# Patient Record
Sex: Male | Born: 2000 | Hispanic: No | Smoking: Never smoker
Health system: Southern US, Community
[De-identification: ages and names within clinical notes are randomized; demographics above are authoritative.]

## PROBLEM LIST (undated history)

## (undated) DIAGNOSIS — Q600 Renal agenesis, unilateral: Secondary | ICD-10-CM

## (undated) DIAGNOSIS — S069XAA Unspecified intracranial injury with loss of consciousness status unknown, initial encounter: Secondary | ICD-10-CM

## (undated) HISTORY — PX: ANKLE SURGERY: SHX546

---
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
## 2020-05-24 ENCOUNTER — Encounter: Payer: Self-pay | Admitting: Emergency Medicine

## 2020-05-24 ENCOUNTER — Other Ambulatory Visit: Payer: Self-pay

## 2020-05-24 DIAGNOSIS — R1010 Upper abdominal pain, unspecified: Secondary | ICD-10-CM | POA: Diagnosis not present

## 2020-05-24 DIAGNOSIS — R197 Diarrhea, unspecified: Secondary | ICD-10-CM | POA: Diagnosis not present

## 2020-05-24 DIAGNOSIS — R112 Nausea with vomiting, unspecified: Secondary | ICD-10-CM | POA: Diagnosis not present

## 2020-05-24 DIAGNOSIS — Z5321 Procedure and treatment not carried out due to patient leaving prior to being seen by health care provider: Secondary | ICD-10-CM | POA: Insufficient documentation

## 2020-05-24 LAB — COMPREHENSIVE METABOLIC PANEL
ALT: 42 U/L (ref 0–44)
AST: 56 U/L — ABNORMAL HIGH (ref 15–41)
Albumin: 4.4 g/dL (ref 3.5–5.0)
Alkaline Phosphatase: 86 U/L (ref 38–126)
Anion gap: 14 (ref 5–15)
BUN: 15 mg/dL (ref 6–20)
CO2: 27 mmol/L (ref 22–32)
Calcium: 9.3 mg/dL (ref 8.9–10.3)
Chloride: 97 mmol/L — ABNORMAL LOW (ref 98–111)
Creatinine, Ser: 1.01 mg/dL (ref 0.61–1.24)
GFR calc Af Amer: >60 mL/min (ref >60)
GFR calc non Af Amer: >60 mL/min (ref >60)
Glucose, Bld: 102 mg/dL — ABNORMAL HIGH (ref 70–99)
Potassium: 4.1 mmol/L (ref 3.5–5.1)
Sodium: 138 mmol/L (ref 135–145)
Total Bilirubin: 1.4 mg/dL — ABNORMAL HIGH (ref 0.3–1.2)
Total Protein: 6.7 g/dL (ref 6.5–8.1)

## 2020-05-24 LAB — CBC
HCT: 47.2 % (ref 39.0–52.0)
Hemoglobin: 17.4 g/dL — ABNORMAL HIGH (ref 13.0–17.0)
MCH: 33.4 pg (ref 26.0–34.0)
MCHC: 36.9 g/dL — ABNORMAL HIGH (ref 30.0–36.0)
MCV: 90.6 fL (ref 80.0–100.0)
Platelets: 223 10*3/uL (ref 150–400)
RBC: 5.21 MIL/uL (ref 4.22–5.81)
RDW: 12.1 % (ref 11.5–15.5)
WBC: 14 10*3/uL — ABNORMAL HIGH (ref 4.0–10.5)
nRBC: 0 % (ref 0.0–0.2)

## 2020-05-24 LAB — URINALYSIS, COMPLETE (UACMP) WITH MICROSCOPIC
Bilirubin Urine: NEGATIVE
Glucose, UA: NEGATIVE mg/dL
Hgb urine dipstick: NEGATIVE
Ketones, ur: 5 mg/dL — AB
Leukocytes,Ua: NEGATIVE
Nitrite: NEGATIVE
Protein, ur: NEGATIVE mg/dL
Specific Gravity, Urine: 1.029 (ref 1.005–1.030)
Squamous Epithelial / HPF: NONE SEEN (ref 0–5)
pH: 6 (ref 5.0–8.0)

## 2020-05-24 LAB — LIPASE, BLOOD: Lipase: 27 U/L (ref 11–51)

## 2020-05-24 MED ORDER — SODIUM CHLORIDE 0.9% FLUSH: 3.0000 mL | Freq: Once | INTRAVENOUS | Status: DC

## 2020-05-24 NOTE — ED Triage Notes (Signed)
Pt to ED from home c/o upper mid abd pain for about a week with nausea and vomiting and diarrhea.  Reports vomiting x3 and diarrhea x10 today.  Denies urinary changes.  States hx of one kidney.  Pt A&Ox4, chest rise even and unlabored, in NAD at this time.

## 2020-05-25 ENCOUNTER — Emergency Department
Admission: EM | Admit: 2020-05-25 | Discharge: 2020-05-25 | Disposition: A | Discharge status: Home / Self Care | Payer: Medicaid Other | Attending: Emergency Medicine | Admitting: Emergency Medicine

## 2020-05-25 HISTORY — DX: Renal agenesis, unilateral: Q60.0

## 2020-05-25 NOTE — ED Notes (Signed)
No answer when called several times from lobby 

## 2022-05-05 ENCOUNTER — Inpatient Hospital Stay (HOSPITAL_COMMUNITY): Payer: Medicaid Other

## 2022-05-05 ENCOUNTER — Emergency Department
Admission: EM | Admit: 2022-05-05 | Discharge: 2022-05-05 | Disposition: A | Discharge status: Other Acute Inpatient Hospital | Payer: Medicaid Other | Attending: Emergency Medicine | Admitting: Emergency Medicine

## 2022-05-05 ENCOUNTER — Emergency Department: Payer: Medicaid Other

## 2022-05-05 ENCOUNTER — Inpatient Hospital Stay (HOSPITAL_COMMUNITY)
Admission: AD | Admit: 2022-05-05 | Discharge: 2022-05-10 | DRG: 101 | Disposition: A | Discharge status: Home / Self Care | Payer: Medicaid Other | Source: Other Acute Inpatient Hospital | Attending: Internal Medicine | Admitting: Internal Medicine

## 2022-05-05 ENCOUNTER — Encounter (HOSPITAL_COMMUNITY): Payer: Self-pay | Admitting: Internal Medicine

## 2022-05-05 DIAGNOSIS — E0590 Thyrotoxicosis, unspecified without thyrotoxic crisis or storm: Secondary | ICD-10-CM | POA: Diagnosis present

## 2022-05-05 DIAGNOSIS — G40901 Epilepsy, unspecified, not intractable, with status epilepticus: Principal | ICD-10-CM | POA: Diagnosis present

## 2022-05-05 DIAGNOSIS — F1010 Alcohol abuse, uncomplicated: Secondary | ICD-10-CM | POA: Diagnosis present

## 2022-05-05 DIAGNOSIS — A770 Spotted fever due to Rickettsia rickettsii: Secondary | ICD-10-CM | POA: Diagnosis present

## 2022-05-05 DIAGNOSIS — R569 Unspecified convulsions: Secondary | ICD-10-CM | POA: Diagnosis present

## 2022-05-05 DIAGNOSIS — E876 Hypokalemia: Secondary | ICD-10-CM

## 2022-05-05 DIAGNOSIS — D72829 Elevated white blood cell count, unspecified: Secondary | ICD-10-CM | POA: Diagnosis present

## 2022-05-05 DIAGNOSIS — G9340 Encephalopathy, unspecified: Secondary | ICD-10-CM | POA: Diagnosis not present

## 2022-05-05 DIAGNOSIS — Q600 Renal agenesis, unilateral: Secondary | ICD-10-CM

## 2022-05-05 DIAGNOSIS — Z87820 Personal history of traumatic brain injury: Secondary | ICD-10-CM

## 2022-05-05 DIAGNOSIS — G40001 Localization-related (focal) (partial) idiopathic epilepsy and epileptic syndromes with seizures of localized onset, not intractable, with status epilepticus: Secondary | ICD-10-CM

## 2022-05-05 DIAGNOSIS — R509 Fever, unspecified: Secondary | ICD-10-CM | POA: Diagnosis present

## 2022-05-05 DIAGNOSIS — Z886 Allergy status to analgesic agent status: Secondary | ICD-10-CM

## 2022-05-05 HISTORY — DX: Unspecified intracranial injury with loss of consciousness status unknown, initial encounter: S06.9XAA

## 2022-05-05 LAB — CBC
HCT: 49.4 % (ref 39.0–52.0)
Hemoglobin: 15.9 g/dL (ref 13.0–17.0)
MCH: 32.7 pg (ref 26.0–34.0)
MCHC: 32.2 g/dL (ref 30.0–36.0)
MCV: 101.6 fL — ABNORMAL HIGH (ref 80.0–100.0)
Platelets: 319 10*3/uL (ref 150–400)
RBC: 4.86 MIL/uL (ref 4.22–5.81)
RDW: 11.1 % — ABNORMAL LOW (ref 11.5–15.5)
WBC: 19.6 10*3/uL — ABNORMAL HIGH (ref 4.0–10.5)
nRBC: 0 % (ref 0.0–0.2)

## 2022-05-05 LAB — URINE DRUG SCREEN, QUALITATIVE (ARMC ONLY)
Amphetamines, Ur Screen: NOT DETECTED
Barbiturates, Ur Screen: NOT DETECTED
Benzodiazepine, Ur Scrn: NOT DETECTED
Cannabinoid 50 Ng, Ur ~~LOC~~: NOT DETECTED
Cocaine Metabolite,Ur ~~LOC~~: NOT DETECTED
MDMA (Ecstasy)Ur Screen: NOT DETECTED
Methadone Scn, Ur: NOT DETECTED
Opiate, Ur Screen: NOT DETECTED
Phencyclidine (PCP) Ur S: NOT DETECTED
Tricyclic, Ur Screen: NOT DETECTED

## 2022-05-05 LAB — COMPREHENSIVE METABOLIC PANEL
ALT: 28 U/L (ref 0–44)
AST: 39 U/L (ref 15–41)
Albumin: 4.9 g/dL (ref 3.5–5.0)
Alkaline Phosphatase: 70 U/L (ref 38–126)
Anion gap: 25 — ABNORMAL HIGH (ref 5–15)
BUN: 9 mg/dL (ref 6–20)
CO2: 14 mmol/L — ABNORMAL LOW (ref 22–32)
Calcium: 10.1 mg/dL (ref 8.9–10.3)
Chloride: 105 mmol/L (ref 98–111)
Creatinine, Ser: 0.83 mg/dL (ref 0.61–1.24)
GFR, Estimated: >60 mL/min (ref >60)
Glucose, Bld: 129 mg/dL — ABNORMAL HIGH (ref 70–99)
Potassium: 3 mmol/L — ABNORMAL LOW (ref 3.5–5.1)
Sodium: 144 mmol/L (ref 135–145)
Total Bilirubin: 0.8 mg/dL (ref 0.3–1.2)
Total Protein: 8 g/dL (ref 6.5–8.1)

## 2022-05-05 LAB — CSF CELL COUNT WITH DIFFERENTIAL
Eosinophils, CSF: 0 %
Lymphs, CSF: 58 %
Monocyte-Macrophage-Spinal Fluid: 42 %
RBC Count, CSF: 0 /mm3 (ref 0–3)
Segmented Neutrophils-CSF: 0 %
Tube #: 3
WBC, CSF: 2 /mm3 (ref 0–5)

## 2022-05-05 LAB — CBG MONITORING, ED: Glucose-Capillary: 114 mg/dL — ABNORMAL HIGH (ref 70–99)

## 2022-05-05 LAB — ETHANOL: Alcohol, Ethyl (B): <10 mg/dL (ref <10)

## 2022-05-05 LAB — LACTIC ACID, PLASMA
Lactic Acid, Venous: >9 mmol/L (ref 0.5–1.9)
Lactic Acid, Venous: 1.8 mmol/L (ref 0.5–1.9)

## 2022-05-05 LAB — MAGNESIUM: Magnesium: 2.1 mg/dL (ref 1.7–2.4)

## 2022-05-05 LAB — AMMONIA: Ammonia: 25 umol/L (ref 9–35)

## 2022-05-05 LAB — TSH: TSH: 0.257 u[IU]/mL — ABNORMAL LOW (ref 0.350–4.500)

## 2022-05-05 LAB — PROTEIN AND GLUCOSE, CSF
Glucose, CSF: 66 mg/dL (ref 40–70)
Total  Protein, CSF: 30 mg/dL (ref 15–45)

## 2022-05-05 LAB — CK: Total CK: 130 U/L (ref 49–397)

## 2022-05-05 LAB — FOLATE: Folate: 17 ng/mL (ref >5.9)

## 2022-05-05 LAB — VITAMIN B12: Vitamin B-12: 199 pg/mL (ref 180–914)

## 2022-05-05 LAB — GLUCOSE, CAPILLARY: Glucose-Capillary: 139 mg/dL — ABNORMAL HIGH (ref 70–99)

## 2022-05-05 MED ORDER — THIAMINE HCL 100 MG PO TABS: 100.0000 mg | ORAL_TABLET | Freq: Every day | ORAL | Status: DC

## 2022-05-05 MED ORDER — INSULIN ASPART 100 UNIT/ML IJ SOLN
0.0000 [IU] | INTRAMUSCULAR | Status: DC
  Administered 2022-05-07: 1 [IU] via SUBCUTANEOUS
  Administered 2022-05-08: 2 [IU] via SUBCUTANEOUS
  Administered 2022-05-08: 1 [IU] via SUBCUTANEOUS

## 2022-05-05 MED ORDER — FOLIC ACID 1 MG PO TABS: 1.0000 mg | ORAL_TABLET | Freq: Every day | ORAL | Status: DC

## 2022-05-05 MED ORDER — VALPROATE SODIUM 100 MG/ML IV SOLN: 1400.0000 mg | Freq: Once | INTRAVENOUS | Status: DC

## 2022-05-05 MED ORDER — LORAZEPAM 2 MG/ML IJ SOLN
2.0000 mg | Freq: Once | INTRAMUSCULAR | Status: AC
  Administered 2022-05-05: 2 mg via INTRAVENOUS

## 2022-05-05 MED ORDER — POTASSIUM CHLORIDE 10 MEQ/100ML IV SOLN
10.0000 meq | INTRAVENOUS | Status: AC
  Administered 2022-05-05 (×2): 10 meq via INTRAVENOUS
  Filled 2022-05-05 (×2): qty 100

## 2022-05-05 MED ORDER — ACYCLOVIR SODIUM 50 MG/ML IV SOLN
10.0000 mg/kg | Freq: Three times a day (TID) | INTRAVENOUS | Status: DC
  Administered 2022-05-05 – 2022-05-08 (×8): 695 mg via INTRAVENOUS
  Filled 2022-05-05 (×10): qty 13.9

## 2022-05-05 MED ORDER — SODIUM CHLORIDE 0.9 % IV SOLN
1.0000 mg | Freq: Every day | INTRAVENOUS | Status: DC
  Administered 2022-05-06 – 2022-05-07 (×2): 1 mg via INTRAVENOUS
  Filled 2022-05-05 (×5): qty 0.2

## 2022-05-05 MED ORDER — LORAZEPAM 2 MG/ML IJ SOLN
2.0000 mg | INTRAMUSCULAR | Status: DC | PRN
  Administered 2022-05-06: 2 mg via INTRAVENOUS
  Filled 2022-05-05 (×2): qty 1

## 2022-05-05 MED ORDER — DEXTROSE 5 % IV SOLN
10.0000 mg/kg | Freq: Once | INTRAVENOUS | Status: AC
  Administered 2022-05-05: 695 mg via INTRAVENOUS
  Filled 2022-05-05: qty 13.9

## 2022-05-05 MED ORDER — VANCOMYCIN HCL 1750 MG/350ML IV SOLN
1750.0000 mg | Freq: Once | INTRAVENOUS | Status: AC
  Administered 2022-05-05: 1750 mg via INTRAVENOUS
  Filled 2022-05-05: qty 350

## 2022-05-05 MED ORDER — VANCOMYCIN HCL 2000 MG/400ML IV SOLN: 2000.0000 mg | Freq: Once | INTRAVENOUS | Status: DC

## 2022-05-05 MED ORDER — LORAZEPAM 2 MG/ML IJ SOLN
1.0000 mg | INTRAMUSCULAR | Status: AC | PRN
  Administered 2022-05-06: 1 mg via INTRAVENOUS
  Filled 2022-05-05: qty 1

## 2022-05-05 MED ORDER — ACETAMINOPHEN 650 MG RE SUPP
650.0000 mg | Freq: Four times a day (QID) | RECTAL | Status: DC | PRN
  Administered 2022-05-05 – 2022-05-06 (×2): 650 mg via RECTAL
  Filled 2022-05-05 (×2): qty 1

## 2022-05-05 MED ORDER — VALPROATE SODIUM 100 MG/ML IV SOLN
15.0000 mg/kg/d | Freq: Four times a day (QID) | INTRAVENOUS | Status: DC
  Administered 2022-05-05 – 2022-05-07 (×6): 260 mg via INTRAVENOUS
  Filled 2022-05-05 (×8): qty 2.6

## 2022-05-05 MED ORDER — SODIUM CHLORIDE 0.9 % IV SOLN
4000.0000 mg | Freq: Once | INTRAVENOUS | Status: AC
  Administered 2022-05-05: 4000 mg via INTRAVENOUS
  Filled 2022-05-05: qty 40

## 2022-05-05 MED ORDER — THIAMINE HCL 100 MG/ML IJ SOLN
100.0000 mg | Freq: Every day | INTRAMUSCULAR | Status: DC
  Administered 2022-05-06 – 2022-05-10 (×5): 100 mg via INTRAVENOUS
  Filled 2022-05-05 (×5): qty 2

## 2022-05-05 MED ORDER — DEXTROSE 5 % IV SOLN: 10.0000 mg/kg | Freq: Three times a day (TID) | INTRAVENOUS | Status: DC

## 2022-05-05 MED ORDER — ADULT MULTIVITAMIN W/MINERALS CH: 1.0000 | ORAL_TABLET | Freq: Every day | ORAL | Status: DC

## 2022-05-05 MED ORDER — VALPROATE SODIUM 100 MG/ML IV SOLN: 15.0000 mg/kg/d | Freq: Four times a day (QID) | INTRAVENOUS | Status: DC

## 2022-05-05 MED ORDER — LACTATED RINGERS IV SOLN: INTRAVENOUS | Status: DC

## 2022-05-05 MED ORDER — LORAZEPAM 2 MG/ML IJ SOLN
INTRAMUSCULAR | Status: AC
  Filled 2022-05-05: qty 1

## 2022-05-05 MED ORDER — LORAZEPAM 1 MG PO TABS
1.0000 mg | ORAL_TABLET | ORAL | Status: AC | PRN
  Filled 2022-05-05: qty 1

## 2022-05-05 MED ORDER — LACTATED RINGERS IV SOLN
INTRAVENOUS | Status: DC
Start: 2022-05-05 — End: 2022-05-07
  Administered 2022-05-07: 1 mL via INTRAVENOUS

## 2022-05-05 MED ORDER — LACTATED RINGERS IV BOLUS
1000.0000 mL | Freq: Once | INTRAVENOUS | Status: AC
  Administered 2022-05-05: 1000 mL via INTRAVENOUS

## 2022-05-05 MED ORDER — FOLIC ACID 1 MG PO TABS
1.0000 mg | ORAL_TABLET | Freq: Every day | ORAL | Status: DC
  Administered 2022-05-08 – 2022-05-09 (×2): 1 mg via ORAL
  Filled 2022-05-05 (×3): qty 1

## 2022-05-05 MED ORDER — SODIUM CHLORIDE 0.9 % IV BOLUS
1000.0000 mL | Freq: Once | INTRAVENOUS | Status: AC
Start: 2022-05-05 — End: 2022-05-05
  Administered 2022-05-05: 1000 mL via INTRAVENOUS

## 2022-05-05 MED ORDER — ONDANSETRON HCL 4 MG/2ML IJ SOLN: 4.0000 mg | Freq: Four times a day (QID) | INTRAMUSCULAR | Status: DC | PRN

## 2022-05-05 MED ORDER — SODIUM CHLORIDE 0.9 % IV SOLN
2.0000 g | Freq: Once | INTRAVENOUS | Status: AC
  Administered 2022-05-05: 2 g via INTRAVENOUS
  Filled 2022-05-05: qty 20

## 2022-05-05 MED ORDER — VALPROATE SODIUM 100 MG/ML IV SOLN
1400.0000 mg | Freq: Once | INTRAVENOUS | Status: AC
  Administered 2022-05-05: 1400 mg via INTRAVENOUS
  Filled 2022-05-05: qty 14

## 2022-05-05 MED ORDER — HEPARIN SODIUM (PORCINE) 5000 UNIT/ML IJ SOLN
5000.0000 [IU] | Freq: Three times a day (TID) | INTRAMUSCULAR | Status: DC
  Administered 2022-05-06 – 2022-05-09 (×10): 5000 [IU] via SUBCUTANEOUS
  Filled 2022-05-05 (×10): qty 1
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
## 2022-05-05 NOTE — Plan of Care (Signed)
Transfer from [facility] Mr. Brad Parks is a 21 year old with past medical history of TBI and congenital solitary kidney who presented for seizures and seizure-like activity.  Noted to have 2 witnessed generalized tonic-clonic seizures.  Initial labs noted lactate greater than 9 with metabolic acidosis and elevated anion gap.  Patient had been given Ativan 2 mg IV with resolution of seizure activity, Keppra for grams IV, bolused with IV fluids, potassium chloride 20 meq IV, vancomycin, Rocephin, and acyclovir.  CT scan of the head was negative.  There was concern for encephalitis for which LP was performed, but analysis seems less likely less likely infectious in nature.  UDS pending.  Case had been discussed with neurology who recommended transfer for likely need of EEG monitoring as patient had not returned to baseline.  Accepted to a telemetry bed.

## 2022-05-05 NOTE — ED Notes (Signed)
Pt accepted to [facility] 5W Room [phone]

## 2022-05-05 NOTE — ED Provider Notes (Signed)
Vitals:   05/05/22 1700 05/05/22 1742  BP: 122/71 111/64  Pulse: 73 86  Resp: (!) 29 (!) 30  Temp:  98.8 F (37.1 C)  SpO2: 100% 100%     Patient resting, no distress.  No change in mental status from last check, no evidence of ongoing seizure-like activity.  Stable appropriate for transfer to higher level of care with capabilities to provide neurology consult and EEG monitoring.  Agreement amongst patient and family to transfer to [facility] health   Sharyn Creamer, MD 05/05/22 1751

## 2022-05-05 NOTE — ED Notes (Signed)
2mg ativan given
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
## 2022-05-05 NOTE — Assessment & Plan Note (Signed)
Acute. Treated with IV kcl at [facility] ED. Will repeat BMP and Mg in AM.
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
## 2022-05-05 NOTE — Progress Notes (Signed)
STAT EEG complete - results pending. ? ?

## 2022-05-05 NOTE — ED Notes (Signed)
Pt appearing to have tremulous activity in all four extremities. Pt eyes closed and appears to be sleeping in bed.
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
## 2022-05-05 NOTE — Progress Notes (Signed)
Pharmacy Antibiotic Note  Brad Parks is a 21 y.o. male admitted on 05/05/2022 with seizures and altered mental status now with concern for HSV encephalitis. WBC 19.8 and tmax 99.6 F. Pharmacy has been consulted for acyclovir dosing.  Plan: Acyclovir 695mg  q8h  Continue lactated ringers at 134mL/h while on acyclovir for renal protection F/u renal function, culture results, and length of therapy  Temp (24hrs), Avg:98.9 F (37.2 C), Min:98.3 F (36.8 C), Max:99.6 F (37.6 C)  Recent Labs  Lab 05/05/22 1002 05/05/22 1213  WBC 19.6*  --   CREATININE 0.83  --   LATICACIDVEN >9.0* 1.8    Estimated Creatinine Clearance: 137 mL/min (by C-G formula based on SCr of 0.83 mg/dL).    No Known Allergies  Antimicrobials this admission: Acyclovir 5/27 >  Dose adjustments this admission:  Microbiology results: 5/27 CSF cx: pending  Thank you for allowing pharmacy to be a part of this patient's care.  6/27 05/05/2022 7:34 PM
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
## 2022-05-05 NOTE — Assessment & Plan Note (Addendum)
Admit to inpatient progressive telemetry bed. Per neurology consult, will continue IV Acyclovir for now until HSV PCR has resulted. Continue with IVF LR 125 ml/hr.  Neurology does not feel that IV abx are warranted at this time based on CSF results. I agree.  Continue with IV ativan 2 mg prn seizures. I gave verbal order to RN to administer 2 mg IV ativan due to active seizure on my arrival to patient room. CBG was 134. Continue with CBG q6h.  Neurology informed of patient's arrival to floor. Defer further AEDs meds to neurology service.  Seizures (Brad Parks) is a Acute illness/condition that poses a threat to life or bodily function.

## 2022-05-05 NOTE — ED Notes (Signed)
[facility] endorsing that pt has Oral temp of 100.6. Pt states that they do not take Tylenol or Ibuprofen due to having only one kidney. Pt declines any PO meds at this time.

## 2022-05-05 NOTE — Progress Notes (Signed)
Mr. Pracht admitted to [phone]. Seized less than 1 min from when transferring to stretcher to bed. Notified MD R. Katrinka Blazing, paged for new admission via AMION.   MD E. Chen in to see pt, pt had seizure upon Chens arrival. Ativan IV 2 mg given at 1924. CBG 139.

## 2022-05-05 NOTE — ED Provider Notes (Signed)
Vitals:   05/05/22 1630 05/05/22 1700  BP: 113/65 122/71  Pulse: 68 73  Resp: (!) 29 (!) 29  Temp:    SpO2: 99% 100%    Patient's somnolent, but alerts to voice.  Is well oriented.  Follows commands moves all extremities to command.  Currently voices no pain or discomfort.  Discussed and updated patient's family including mother.  Given that a bed has been assigned at [facility], they are comfortable with the plan to transfer to [facility] for higher level of care, but is significant delay they would like to consider transfer to [facility] or [facility].   Sharyn Creamer, MD 05/05/22 [phone]

## 2022-05-05 NOTE — Progress Notes (Signed)
vLtm started  all impedances below 10khms  atrium to monitor   pt event button tested    MRI compatible LEADS being used

## 2022-05-06 ENCOUNTER — Inpatient Hospital Stay (HOSPITAL_COMMUNITY): Payer: Medicaid Other

## 2022-05-06 DIAGNOSIS — R569 Unspecified convulsions: Secondary | ICD-10-CM | POA: Diagnosis not present

## 2022-05-06 LAB — CBC WITH DIFFERENTIAL/PLATELET
Abs Immature Granulocytes: 0.07 10*3/uL (ref 0.00–0.07)
Basophils Absolute: 0 10*3/uL (ref 0.0–0.1)
Basophils Relative: 0 %
Eosinophils Absolute: 0 10*3/uL (ref 0.0–0.5)
Eosinophils Relative: 0 %
HCT: 38.8 % — ABNORMAL LOW (ref 39.0–52.0)
Hemoglobin: 13.4 g/dL (ref 13.0–17.0)
Immature Granulocytes: 1 %
Lymphocytes Relative: 11 %
Lymphs Abs: 1.6 10*3/uL (ref 0.7–4.0)
MCH: 32.8 pg (ref 26.0–34.0)
MCHC: 34.5 g/dL (ref 30.0–36.0)
MCV: 95.1 fL (ref 80.0–100.0)
Monocytes Absolute: 0.6 10*3/uL (ref 0.1–1.0)
Monocytes Relative: 4 %
Neutro Abs: 12.7 10*3/uL — ABNORMAL HIGH (ref 1.7–7.7)
Neutrophils Relative %: 84 %
Platelets: 227 10*3/uL (ref 150–400)
RBC: 4.08 MIL/uL — ABNORMAL LOW (ref 4.22–5.81)
RDW: 11.1 % — ABNORMAL LOW (ref 11.5–15.5)
WBC: 15 10*3/uL — ABNORMAL HIGH (ref 4.0–10.5)
nRBC: 0 % (ref 0.0–0.2)

## 2022-05-06 LAB — COMPREHENSIVE METABOLIC PANEL
ALT: 22 U/L (ref 0–44)
AST: 23 U/L (ref 15–41)
Albumin: 3.4 g/dL — ABNORMAL LOW (ref 3.5–5.0)
Alkaline Phosphatase: 53 U/L (ref 38–126)
Anion gap: 10 (ref 5–15)
BUN: 6 mg/dL (ref 6–20)
CO2: 22 mmol/L (ref 22–32)
Calcium: 9.1 mg/dL (ref 8.9–10.3)
Chloride: 109 mmol/L (ref 98–111)
Creatinine, Ser: 0.89 mg/dL (ref 0.61–1.24)
GFR, Estimated: >60 mL/min (ref >60)
Glucose, Bld: 121 mg/dL — ABNORMAL HIGH (ref 70–99)
Potassium: 3.6 mmol/L (ref 3.5–5.1)
Sodium: 141 mmol/L (ref 135–145)
Total Bilirubin: 0.8 mg/dL (ref 0.3–1.2)
Total Protein: 5.5 g/dL — ABNORMAL LOW (ref 6.5–8.1)

## 2022-05-06 LAB — GLUCOSE, CAPILLARY
Glucose-Capillary: 100 mg/dL — ABNORMAL HIGH (ref 70–99)
Glucose-Capillary: 112 mg/dL — ABNORMAL HIGH (ref 70–99)
Glucose-Capillary: 115 mg/dL — ABNORMAL HIGH (ref 70–99)
Glucose-Capillary: 115 mg/dL — ABNORMAL HIGH (ref 70–99)
Glucose-Capillary: 115 mg/dL — ABNORMAL HIGH (ref 70–99)
Glucose-Capillary: 117 mg/dL — ABNORMAL HIGH (ref 70–99)

## 2022-05-06 LAB — TSH: TSH: 0.222 u[IU]/mL — ABNORMAL LOW (ref 0.350–4.500)

## 2022-05-06 LAB — T4, FREE: Free T4: 1.32 ng/dL — ABNORMAL HIGH (ref 0.61–1.12)

## 2022-05-06 LAB — HIV ANTIBODY (ROUTINE TESTING W REFLEX): HIV Screen 4th Generation wRfx: NONREACTIVE

## 2022-05-06 LAB — HEMOGLOBIN A1C
Hgb A1c MFr Bld: 5.2 % (ref 4.8–5.6)
Mean Plasma Glucose: 102.54 mg/dL

## 2022-05-06 LAB — MAGNESIUM: Magnesium: 1.7 mg/dL (ref 1.7–2.4)

## 2022-05-06 LAB — MRSA NEXT GEN BY PCR, NASAL: MRSA by PCR Next Gen: NOT DETECTED

## 2022-05-06 MED ORDER — SODIUM CHLORIDE 0.9 % IV SOLN
2.0000 g | INTRAVENOUS | Status: DC
  Administered 2022-05-06 – 2022-05-09 (×4): 2 g via INTRAVENOUS
  Filled 2022-05-06 (×5): qty 20

## 2022-05-06 MED ORDER — METHIMAZOLE 5 MG PO TABS
5.0000 mg | ORAL_TABLET | Freq: Three times a day (TID) | ORAL | Status: DC
  Administered 2022-05-06 – 2022-05-10 (×13): 5 mg via ORAL
  Filled 2022-05-06 (×14): qty 1

## 2022-05-06 MED ORDER — SODIUM CHLORIDE 0.9 % IV SOLN
100.0000 mg | Freq: Two times a day (BID) | INTRAVENOUS | Status: DC
  Administered 2022-05-06 – 2022-05-10 (×8): 100 mg via INTRAVENOUS
  Filled 2022-05-06 (×10): qty 100

## 2022-05-06 NOTE — Progress Notes (Signed)
EEG maint complete.  ?

## 2022-05-06 NOTE — Plan of Care (Signed)
  Problem: Education: Goal: Knowledge of General Education information will improve Description Including pain rating scale, medication(s)/side effects and non-pharmacologic comfort measures Outcome: Progressing   
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
## 2022-05-07 DIAGNOSIS — R569 Unspecified convulsions: Secondary | ICD-10-CM | POA: Diagnosis not present

## 2022-05-07 LAB — COMPREHENSIVE METABOLIC PANEL
ALT: 19 U/L (ref 0–44)
AST: 17 U/L (ref 15–41)
Albumin: 3.4 g/dL — ABNORMAL LOW (ref 3.5–5.0)
Alkaline Phosphatase: 46 U/L (ref 38–126)
Anion gap: 10 (ref 5–15)
BUN: <5 mg/dL — ABNORMAL LOW (ref 6–20)
CO2: 22 mmol/L (ref 22–32)
Calcium: 9 mg/dL (ref 8.9–10.3)
Chloride: 111 mmol/L (ref 98–111)
Creatinine, Ser: 0.79 mg/dL (ref 0.61–1.24)
GFR, Estimated: >60 mL/min (ref >60)
Glucose, Bld: 112 mg/dL — ABNORMAL HIGH (ref 70–99)
Potassium: 3.3 mmol/L — ABNORMAL LOW (ref 3.5–5.1)
Sodium: 143 mmol/L (ref 135–145)
Total Bilirubin: 0.7 mg/dL (ref 0.3–1.2)
Total Protein: 5.4 g/dL — ABNORMAL LOW (ref 6.5–8.1)

## 2022-05-07 LAB — GASTROINTESTINAL PANEL BY PCR, STOOL (REPLACES STOOL CULTURE)

## 2022-05-07 LAB — CBC WITH DIFFERENTIAL/PLATELET
Abs Immature Granulocytes: 0.03 10*3/uL (ref 0.00–0.07)
Basophils Absolute: 0 10*3/uL (ref 0.0–0.1)
Basophils Relative: 0 %
Eosinophils Absolute: 0 10*3/uL (ref 0.0–0.5)
Eosinophils Relative: 0 %
HCT: 38.1 % — ABNORMAL LOW (ref 39.0–52.0)
Hemoglobin: 13.2 g/dL (ref 13.0–17.0)
Immature Granulocytes: 0 %
Lymphocytes Relative: 15 %
Lymphs Abs: 1.7 10*3/uL (ref 0.7–4.0)
MCH: 32.8 pg (ref 26.0–34.0)
MCHC: 34.6 g/dL (ref 30.0–36.0)
MCV: 94.5 fL (ref 80.0–100.0)
Monocytes Absolute: 0.8 10*3/uL (ref 0.1–1.0)
Monocytes Relative: 7 %
Neutro Abs: 9.1 10*3/uL — ABNORMAL HIGH (ref 1.7–7.7)
Neutrophils Relative %: 78 %
Platelets: 194 10*3/uL (ref 150–400)
RBC: 4.03 MIL/uL — ABNORMAL LOW (ref 4.22–5.81)
RDW: 10.9 % — ABNORMAL LOW (ref 11.5–15.5)
WBC: 11.6 10*3/uL — ABNORMAL HIGH (ref 4.0–10.5)
nRBC: 0 % (ref 0.0–0.2)

## 2022-05-07 LAB — C-REACTIVE PROTEIN: CRP: 0.5 mg/dL (ref <1.0)

## 2022-05-07 LAB — VITAMIN B12: Vitamin B-12: 183 pg/mL (ref 180–914)

## 2022-05-07 LAB — C DIFFICILE QUICK SCREEN W PCR REFLEX
C Diff antigen: POSITIVE — AB
C Diff toxin: NEGATIVE

## 2022-05-07 LAB — GLUCOSE, CAPILLARY
Glucose-Capillary: 100 mg/dL — ABNORMAL HIGH (ref 70–99)
Glucose-Capillary: 104 mg/dL — ABNORMAL HIGH (ref 70–99)
Glucose-Capillary: 124 mg/dL — ABNORMAL HIGH (ref 70–99)
Glucose-Capillary: 129 mg/dL — ABNORMAL HIGH (ref 70–99)
Glucose-Capillary: 94 mg/dL (ref 70–99)
Glucose-Capillary: 97 mg/dL (ref 70–99)

## 2022-05-07 LAB — HSV 1/2 PCR, CSF
HSV-1 DNA: NEGATIVE
HSV-2 DNA: NEGATIVE

## 2022-05-07 LAB — CLOSTRIDIUM DIFFICILE BY PCR, REFLEXED: Toxigenic C. Difficile by PCR: NEGATIVE

## 2022-05-07 MED ORDER — LACTATED RINGERS IV SOLN: INTRAVENOUS | Status: DC

## 2022-05-07 MED ORDER — NICOTINE 21 MG/24HR TD PT24
21.0000 mg | MEDICATED_PATCH | Freq: Every day | TRANSDERMAL | Status: DC
  Administered 2022-05-07 – 2022-05-09 (×3): 21 mg via TRANSDERMAL
  Filled 2022-05-07 (×3): qty 1

## 2022-05-07 MED ORDER — CYANOCOBALAMIN 1000 MCG/ML IJ SOLN
1000.0000 ug | Freq: Every day | INTRAMUSCULAR | Status: AC
  Administered 2022-05-07 – 2022-05-09 (×3): 1000 ug via SUBCUTANEOUS
  Filled 2022-05-07 (×3): qty 1

## 2022-05-07 MED ORDER — POTASSIUM CHLORIDE CRYS ER 20 MEQ PO TBCR
40.0000 meq | EXTENDED_RELEASE_TABLET | Freq: Once | ORAL | Status: AC
  Administered 2022-05-07: 40 meq via ORAL
  Filled 2022-05-07: qty 2

## 2022-05-07 MED ORDER — VITAMIN B-12 1000 MCG PO TABS
1000.0000 ug | ORAL_TABLET | Freq: Every day | ORAL | Status: DC
  Administered 2022-05-10: 1000 ug via ORAL
  Filled 2022-05-07: qty 1

## 2022-05-07 MED ORDER — ACETAMINOPHEN 325 MG PO TABS
650.0000 mg | ORAL_TABLET | Freq: Four times a day (QID) | ORAL | Status: DC | PRN
  Administered 2022-05-07 – 2022-05-08 (×2): 650 mg via ORAL
  Filled 2022-05-07 (×2): qty 2

## 2022-05-07 MED ORDER — VALPROATE SODIUM 100 MG/ML IV SOLN
500.0000 mg | Freq: Two times a day (BID) | INTRAVENOUS | Status: DC
  Administered 2022-05-07 – 2022-05-09 (×4): 500 mg via INTRAVENOUS
  Filled 2022-05-07 (×6): qty 5

## 2022-05-07 MED ORDER — LOPERAMIDE HCL 2 MG PO CAPS: 2.0000 mg | ORAL_CAPSULE | Freq: Four times a day (QID) | ORAL | Status: DC | PRN

## 2022-05-07 NOTE — TOC CAGE-AID Note (Addendum)
Transition of Care [facility]) - CAGE-AID Screening   Patient Details  Name: Brad Parks MRN: [id number] Date of Birth: 14-Jun-2001  Transition of Care [facility]) CM/SW Contact:    Deron Poole, LCSW Phone Number: 05/07/2022, 2:38 PM   Clinical Narrative: CSW spoke with [name] at bedside along with his spouse to address the SA consult however [name] declined needing any resources/support for etoh. [name] reported that he does have a PCP and uses the [facility] and sometimes the [facility] as his pharmacy.  TOC will continue to follow for DC needs.   CAGE-AID Screening:    Have You Ever Felt You Ought to Cut Down on Your Drinking or Drug Use?: No Have People Annoyed You By Critizing Your Drinking Or Drug Use?: No Have You Felt Bad Or Guilty About Your Drinking Or Drug Use?: No Have You Ever Had a Drink or Used Drugs First Thing In The Morning to Steady Your Nerves or to Get Rid of a Hangover?: No CAGE-AID Score: 0  Substance Abuse Education Offered: Yes  Substance abuse interventions: Other (must comment) (substance use educational materials offered but pt. declined.)

## 2022-05-07 NOTE — Progress Notes (Signed)
The patient is having frequent BMs and complaining of pain when wiping. Barrier cream applied and notified Dr. Toniann Fail. Awaiting for a response. Will continue to monitor.
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
## 2022-05-07 NOTE — Progress Notes (Signed)
OT Cancellation Note  Patient Details Name: CONLIN LEBEAU MRN: [id number] DOB: 01-Oct-2001   Cancelled Treatment:    Reason Eval/Treat Not Completed: OT screened, no needs identified, will sign off  Malka So 05/07/2022, 2:09 PM Nestor Lewandowsky, OTR/L Acute Rehabilitation Services Pager: [phone] Office: [phone]
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
## 2022-05-07 NOTE — Progress Notes (Addendum)
New order received for GI panel from Dr. Toniann Fail.
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
## 2022-05-08 DIAGNOSIS — R569 Unspecified convulsions: Secondary | ICD-10-CM | POA: Diagnosis not present

## 2022-05-08 LAB — GLUCOSE, CAPILLARY
Glucose-Capillary: 110 mg/dL — ABNORMAL HIGH (ref 70–99)
Glucose-Capillary: 119 mg/dL — ABNORMAL HIGH (ref 70–99)
Glucose-Capillary: 129 mg/dL — ABNORMAL HIGH (ref 70–99)
Glucose-Capillary: 132 mg/dL — ABNORMAL HIGH (ref 70–99)
Glucose-Capillary: 170 mg/dL — ABNORMAL HIGH (ref 70–99)

## 2022-05-08 LAB — CSF CULTURE W GRAM STAIN
Culture: NO GROWTH
Gram Stain: NONE SEEN
Special Requests: NORMAL

## 2022-05-08 LAB — CBC WITH DIFFERENTIAL/PLATELET
Abs Immature Granulocytes: 0.03 10*3/uL (ref 0.00–0.07)
Basophils Absolute: 0 10*3/uL (ref 0.0–0.1)
Basophils Relative: 0 %
Eosinophils Absolute: 0 10*3/uL (ref 0.0–0.5)
Eosinophils Relative: 0 %
HCT: 39.9 % (ref 39.0–52.0)
Hemoglobin: 13.6 g/dL (ref 13.0–17.0)
Immature Granulocytes: 0 %
Lymphocytes Relative: 21 %
Lymphs Abs: 2 10*3/uL (ref 0.7–4.0)
MCH: 32.5 pg (ref 26.0–34.0)
MCHC: 34.1 g/dL (ref 30.0–36.0)
MCV: 95.5 fL (ref 80.0–100.0)
Monocytes Absolute: 0.7 10*3/uL (ref 0.1–1.0)
Monocytes Relative: 8 %
Neutro Abs: 7 10*3/uL (ref 1.7–7.7)
Neutrophils Relative %: 71 %
Platelets: 181 10*3/uL (ref 150–400)
RBC: 4.18 MIL/uL — ABNORMAL LOW (ref 4.22–5.81)
RDW: 10.9 % — ABNORMAL LOW (ref 11.5–15.5)
WBC: 9.8 10*3/uL (ref 4.0–10.5)
nRBC: 0 % (ref 0.0–0.2)

## 2022-05-08 LAB — COMPREHENSIVE METABOLIC PANEL
ALT: 18 U/L (ref 0–44)
AST: 17 U/L (ref 15–41)
Albumin: 3.5 g/dL (ref 3.5–5.0)
Alkaline Phosphatase: 40 U/L (ref 38–126)
Anion gap: 9 (ref 5–15)
BUN: <5 mg/dL — ABNORMAL LOW (ref 6–20)
CO2: 22 mmol/L (ref 22–32)
Calcium: 9.3 mg/dL (ref 8.9–10.3)
Chloride: 111 mmol/L (ref 98–111)
Creatinine, Ser: 0.81 mg/dL (ref 0.61–1.24)
GFR, Estimated: >60 mL/min (ref >60)
Glucose, Bld: 128 mg/dL — ABNORMAL HIGH (ref 70–99)
Potassium: 3.3 mmol/L — ABNORMAL LOW (ref 3.5–5.1)
Sodium: 142 mmol/L (ref 135–145)
Total Bilirubin: 0.3 mg/dL (ref 0.3–1.2)
Total Protein: 5.5 g/dL — ABNORMAL LOW (ref 6.5–8.1)

## 2022-05-08 LAB — C-REACTIVE PROTEIN: CRP: <0.5 mg/dL (ref <1.0)

## 2022-05-08 LAB — MAGNESIUM: Magnesium: 1.6 mg/dL — ABNORMAL LOW (ref 1.7–2.4)

## 2022-05-08 LAB — VITAMIN B1: Vitamin B1 (Thiamine): 171.8 nmol/L (ref 66.5–200.0)

## 2022-05-08 LAB — LYME DISEASE SEROLOGY W/REFLEX
Lyme Total Antibody EIA: NEGATIVE
Lyme Total Antibody EIA: NEGATIVE

## 2022-05-08 LAB — VALPROIC ACID LEVEL: Valproic Acid Lvl: 59 ug/mL (ref 50.0–100.0)

## 2022-05-08 MED ORDER — MELATONIN 3 MG PO TABS
3.0000 mg | ORAL_TABLET | Freq: Every day | ORAL | Status: DC
  Administered 2022-05-08 – 2022-05-09 (×2): 3 mg via ORAL
  Filled 2022-05-08 (×2): qty 1

## 2022-05-08 MED ORDER — PROCHLORPERAZINE EDISYLATE 10 MG/2ML IJ SOLN
10.0000 mg | Freq: Once | INTRAMUSCULAR | Status: AC
  Administered 2022-05-08: 10 mg via INTRAVENOUS
  Filled 2022-05-08: qty 2

## 2022-05-08 MED ORDER — CODEINE SULFATE 15 MG PO TABS
30.0000 mg | ORAL_TABLET | Freq: Once | ORAL | Status: AC
  Administered 2022-05-08: 30 mg via ORAL
  Filled 2022-05-08: qty 2

## 2022-05-08 MED ORDER — POTASSIUM CHLORIDE CRYS ER 20 MEQ PO TBCR
40.0000 meq | EXTENDED_RELEASE_TABLET | Freq: Two times a day (BID) | ORAL | Status: AC
  Administered 2022-05-08 (×2): 40 meq via ORAL
  Filled 2022-05-08 (×2): qty 2

## 2022-05-08 MED ORDER — MAGNESIUM SULFATE 4 GM/100ML IV SOLN
4.0000 g | Freq: Once | INTRAVENOUS | Status: AC
  Administered 2022-05-08: 4 g via INTRAVENOUS
  Filled 2022-05-08: qty 100
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
## 2022-05-09 DIAGNOSIS — R569 Unspecified convulsions: Secondary | ICD-10-CM | POA: Diagnosis not present

## 2022-05-09 LAB — ROCKY MTN SPOTTED FVR ABS PNL(IGG+IGM)
RMSF IgG: UNDETERMINED
RMSF IgG: NEGATIVE
RMSF IgM: 0.2 index (ref 0.00–0.89)
RMSF IgM: 0.27 index (ref 0.00–0.89)

## 2022-05-09 LAB — CBC WITH DIFFERENTIAL/PLATELET
Abs Immature Granulocytes: 0.02 10*3/uL (ref 0.00–0.07)
Basophils Absolute: 0 10*3/uL (ref 0.0–0.1)
Basophils Relative: 0 %
Eosinophils Absolute: 0.1 10*3/uL (ref 0.0–0.5)
Eosinophils Relative: 1 %
HCT: 40.9 % (ref 39.0–52.0)
Hemoglobin: 14.4 g/dL (ref 13.0–17.0)
Immature Granulocytes: 0 %
Lymphocytes Relative: 31 %
Lymphs Abs: 2 10*3/uL (ref 0.7–4.0)
MCH: 33 pg (ref 26.0–34.0)
MCHC: 35.2 g/dL (ref 30.0–36.0)
MCV: 93.8 fL (ref 80.0–100.0)
Monocytes Absolute: 0.5 10*3/uL (ref 0.1–1.0)
Monocytes Relative: 8 %
Neutro Abs: 3.9 10*3/uL (ref 1.7–7.7)
Neutrophils Relative %: 60 %
Platelets: 175 10*3/uL (ref 150–400)
RBC: 4.36 MIL/uL (ref 4.22–5.81)
RDW: 11 % — ABNORMAL LOW (ref 11.5–15.5)
WBC: 6.5 10*3/uL (ref 4.0–10.5)
nRBC: 0 % (ref 0.0–0.2)

## 2022-05-09 LAB — COMPREHENSIVE METABOLIC PANEL
ALT: 43 U/L (ref 0–44)
AST: 21 U/L (ref 15–41)
Albumin: 3.8 g/dL (ref 3.5–5.0)
Alkaline Phosphatase: 48 U/L (ref 38–126)
Anion gap: 7 (ref 5–15)
BUN: <5 mg/dL — ABNORMAL LOW (ref 6–20)
CO2: 24 mmol/L (ref 22–32)
Calcium: 9.5 mg/dL (ref 8.9–10.3)
Chloride: 110 mmol/L (ref 98–111)
Creatinine, Ser: 0.79 mg/dL (ref 0.61–1.24)
GFR, Estimated: >60 mL/min (ref >60)
Glucose, Bld: 100 mg/dL — ABNORMAL HIGH (ref 70–99)
Potassium: 4 mmol/L (ref 3.5–5.1)
Sodium: 141 mmol/L (ref 135–145)
Total Bilirubin: 0.6 mg/dL (ref 0.3–1.2)
Total Protein: 6 g/dL — ABNORMAL LOW (ref 6.5–8.1)

## 2022-05-09 LAB — VALPROIC ACID LEVEL: Valproic Acid Lvl: 53 ug/mL (ref 50.0–100.0)

## 2022-05-09 LAB — MAGNESIUM: Magnesium: 2.1 mg/dL (ref 1.7–2.4)

## 2022-05-09 LAB — GLUCOSE, CAPILLARY
Glucose-Capillary: 105 mg/dL — ABNORMAL HIGH (ref 70–99)
Glucose-Capillary: 126 mg/dL — ABNORMAL HIGH (ref 70–99)
Glucose-Capillary: 94 mg/dL (ref 70–99)
Glucose-Capillary: 96 mg/dL (ref 70–99)

## 2022-05-09 LAB — RMSF, IGG, IFA: RMSF, IGG, IFA: <1:64 {titer}

## 2022-05-09 LAB — C-REACTIVE PROTEIN: CRP: <0.5 mg/dL (ref <1.0)

## 2022-05-09 MED ORDER — PANTOPRAZOLE SODIUM 40 MG PO TBEC
40.0000 mg | DELAYED_RELEASE_TABLET | Freq: Every day | ORAL | Status: DC
  Administered 2022-05-10: 40 mg via ORAL
  Filled 2022-05-09: qty 1

## 2022-05-09 MED ORDER — NICOTINE 21 MG/24HR TD PT24: 21.0000 mg | MEDICATED_PATCH | Freq: Every day | TRANSDERMAL | Status: DC

## 2022-05-09 MED ORDER — DIVALPROEX SODIUM 125 MG PO CSDR
500.0000 mg | DELAYED_RELEASE_CAPSULE | Freq: Two times a day (BID) | ORAL | Status: DC
  Administered 2022-05-09 – 2022-05-10 (×2): 500 mg via ORAL
  Filled 2022-05-09 (×2): qty 4

## 2022-05-09 MED ORDER — NICOTINE 21 MG/24HR TD PT24
21.0000 mg | MEDICATED_PATCH | Freq: Every day | TRANSDERMAL | Status: DC
Start: 2022-05-09 — End: 2022-05-10
  Administered 2022-05-09: 21 mg via TRANSDERMAL
  Filled 2022-05-09 (×2): qty 1

## 2022-05-09 MED ORDER — FOLIC ACID 5 MG/ML IJ SOLN
1.0000 mg | Freq: Every day | INTRAMUSCULAR | Status: DC
  Filled 2022-05-09: qty 0.2

## 2022-05-09 MED ORDER — FOLIC ACID 1 MG PO TABS
1.0000 mg | ORAL_TABLET | Freq: Every day | ORAL | Status: DC
  Administered 2022-05-10: 1 mg via ORAL
  Filled 2022-05-09: qty 1

## 2022-05-09 MED ORDER — OMEPRAZOLE MAGNESIUM 20 MG PO TBEC: 20.0000 mg | DELAYED_RELEASE_TABLET | Freq: Every day | ORAL | Status: DC
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
## 2022-05-09 NOTE — Progress Notes (Signed)
Ramesh,MD at bedside requested that q4 POCT blood sugar not be taken at this moment. He is reviewing pt case, POCT blood sugar q4 may be DC. He stated that 0723 lab draw glucose of 100 mg/ml was applicable for 0800 POCT BS glucose.

## 2022-05-09 NOTE — Progress Notes (Addendum)
Awaiting Depacon 500mg  IVPB delivery from RX due at 1000. Request made to RX. Pt requesting to take shower at this time. Several IV medications due including antibiotics. Pt would like medications administered after he completes shower. MD notified.
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
## 2022-05-10 ENCOUNTER — Other Ambulatory Visit (HOSPITAL_COMMUNITY): Payer: Self-pay

## 2022-05-10 LAB — CBC WITH DIFFERENTIAL/PLATELET
Abs Immature Granulocytes: 0.03 10*3/uL (ref 0.00–0.07)
Basophils Absolute: 0 10*3/uL (ref 0.0–0.1)
Basophils Relative: 0 %
Eosinophils Absolute: 0.1 10*3/uL (ref 0.0–0.5)
Eosinophils Relative: 2 %
HCT: 41.8 % (ref 39.0–52.0)
Hemoglobin: 14.5 g/dL (ref 13.0–17.0)
Immature Granulocytes: 0 %
Lymphocytes Relative: 31 %
Lymphs Abs: 2.5 10*3/uL (ref 0.7–4.0)
MCH: 33.2 pg (ref 26.0–34.0)
MCHC: 34.7 g/dL (ref 30.0–36.0)
MCV: 95.7 fL (ref 80.0–100.0)
Monocytes Absolute: 0.7 10*3/uL (ref 0.1–1.0)
Monocytes Relative: 8 %
Neutro Abs: 4.8 10*3/uL (ref 1.7–7.7)
Neutrophils Relative %: 59 %
Platelets: 174 10*3/uL (ref 150–400)
RBC: 4.37 MIL/uL (ref 4.22–5.81)
RDW: 10.9 % — ABNORMAL LOW (ref 11.5–15.5)
WBC: 8.2 10*3/uL (ref 4.0–10.5)
nRBC: 0 % (ref 0.0–0.2)

## 2022-05-10 LAB — COMPREHENSIVE METABOLIC PANEL
ALT: 43 U/L (ref 0–44)
AST: 19 U/L (ref 15–41)
Albumin: 3.6 g/dL (ref 3.5–5.0)
Alkaline Phosphatase: 51 U/L (ref 38–126)
Anion gap: 8 (ref 5–15)
BUN: 8 mg/dL (ref 6–20)
CO2: 27 mmol/L (ref 22–32)
Calcium: 9.3 mg/dL (ref 8.9–10.3)
Chloride: 108 mmol/L (ref 98–111)
Creatinine, Ser: 0.75 mg/dL (ref 0.61–1.24)
GFR, Estimated: >60 mL/min (ref >60)
Glucose, Bld: 99 mg/dL (ref 70–99)
Potassium: 3.5 mmol/L (ref 3.5–5.1)
Sodium: 143 mmol/L (ref 135–145)
Total Bilirubin: 0.3 mg/dL (ref 0.3–1.2)
Total Protein: 5.6 g/dL — ABNORMAL LOW (ref 6.5–8.1)

## 2022-05-10 LAB — MAGNESIUM: Magnesium: 2.3 mg/dL (ref 1.7–2.4)

## 2022-05-10 LAB — C-REACTIVE PROTEIN: CRP: 0.6 mg/dL (ref <1.0)

## 2022-05-10 MED ORDER — VITAMIN B-12 1000 MCG PO TABS
1000.0000 ug | ORAL_TABLET | Freq: Every day | ORAL | 0 refills | Status: AC
  Filled 2022-05-10 (×2): qty 30, 30d supply, fill #0

## 2022-05-10 MED ORDER — FOLIC ACID 1 MG PO TABS
1.0000 mg | ORAL_TABLET | Freq: Every day | ORAL | 1 refills | Status: AC
  Filled 2022-05-10: qty 30, 30d supply, fill #0

## 2022-05-10 MED ORDER — METHIMAZOLE 5 MG PO TABS
5.0000 mg | ORAL_TABLET | Freq: Three times a day (TID) | ORAL | 1 refills | Status: AC
  Filled 2022-05-10: qty 90, 30d supply, fill #0

## 2022-05-10 MED ORDER — DIVALPROEX SODIUM 125 MG PO CSDR
500.0000 mg | DELAYED_RELEASE_CAPSULE | Freq: Two times a day (BID) | ORAL | 2 refills | Status: AC
  Filled 2022-05-10: qty 240, 30d supply, fill #0
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
## 2022-05-10 NOTE — TOC Transition Note (Signed)
Transition of Care [facility]) - CM/SW Discharge Note   Patient Details  Name: Brad Parks MRN: [id number] Date of Birth: 2001-05-23  Transition of Care [facility]) CM/SW Contact:  Cyndi Bender, RN Phone Number: 05/10/2022, 9:30 AM   Clinical Narrative:    Patient stable for discharge. Finance is at bedside to transport home. Prescriptions sent to [facility]. No other TOC needs.    Final next level of care: Home/Self Care Barriers to Discharge: Barriers Resolved   Patient Goals and CMS Choice Patient states their goals for this hospitalization and ongoing recovery are:: return home      Discharge Placement             home          Discharge Plan and Services                   home                  Social Determinants of Health (SDOH) Interventions     Readmission Risk Interventions    05/10/2022    9:30 AM  Readmission Risk Prevention Plan  Post Dischage Appt Complete  Medication Screening Complete  Transportation Screening Complete

## 2022-05-10 NOTE — Plan of Care (Addendum)
RMSF serology neg Continue Depakote and seizure precautions Follow with outpt neurology 4-6 weeks. D/W Dr Jonathon Bellows  -- Milon Dikes, MD Neurologist [facility] Pager: [phone]  SEIZURE PRECAUTIONS Per [facility] statutes, patients with seizures are not allowed to drive until they have been seizure-free for six months.   Use caution when using heavy equipment or power tools. Avoid working on ladders or at heights. Take showers instead of baths. Ensure the water temperature is not too high on the home water heater. Do not go swimming alone. Do not lock yourself in a room alone (i.e. bathroom). When caring for infants or small children, sit down when holding, feeding, or changing them to minimize risk of injury to the child in the event you have a seizure. Maintain good sleep hygiene. Avoid alcohol.    If patient has another seizure, call 911 and bring them back to the ED if: A.  The seizure lasts longer than 5 minutes.      B.  The patient doesn't wake shortly after the seizure or has new problems such as difficulty seeing, speaking or moving following the seizure C.  The patient was injured during the seizure D.  The patient has a temperature over 102 F (39C) E.  The patient vomited during the seizure and now is having trouble breathing

## 2022-05-11 LAB — METHYLMALONIC ACID, SERUM
Methylmalonic Acid, Quantitative: 69 nmol/L (ref 0–378)
Methylmalonic Acid, Quantitative: 67 nmol/L (ref 0–378)

## 2022-05-14 ENCOUNTER — Encounter: Payer: Self-pay | Admitting: Neurology

## 2022-05-15 LAB — MISC LABCORP TEST (SEND OUT): Labcorp test code: 160457

## 2022-06-05 ENCOUNTER — Ambulatory Visit: Payer: Medicaid Other | Admitting: Neurology

## 2022-06-05 ENCOUNTER — Encounter: Payer: Self-pay | Admitting: Neurology

## 2022-06-05 DIAGNOSIS — Z029 Encounter for administrative examinations, unspecified: Secondary | ICD-10-CM

## 2022-07-10 ENCOUNTER — Encounter: Payer: Self-pay | Admitting: Neurology

## 2022-07-10 ENCOUNTER — Ambulatory Visit: Payer: Medicaid Other | Admitting: Neurology

## 2023-05-28 IMAGING — MR MR HEAD W/O CM
11 series · 43 of 48 positions shown · non-contrast
Comparison: Head CT from yesterday

CLINICAL DATA: New onset seizure.  No history of trauma.

EXAM:
MRI HEAD WITHOUT CONTRAST
TECHNIQUE: Multiplanar, multiecho pulse sequences of the brain and surrounding
structures were obtained without intravenous contrast.

[Series 5: DWI · axial · 3.0mm · 0.88mm/px · z∈[-61,+78]mm · 5 of 96 slices shown (1 of 4)]
[im 1/96]
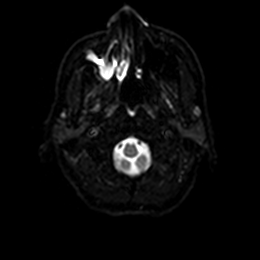
[im 24/96]
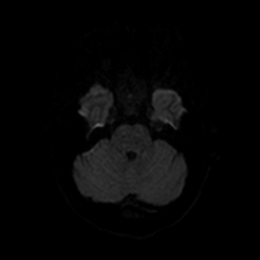
[im 48/96]
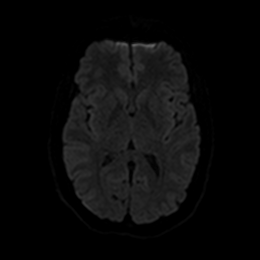
[im 72/96]
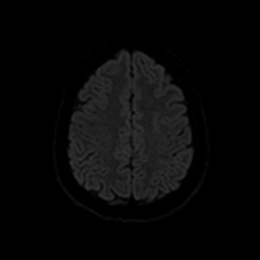
[im 96/96]
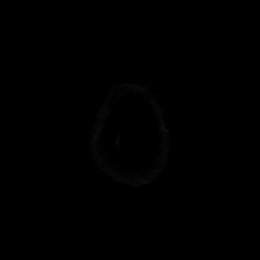

[Series 6: DWI · axial · 3.0mm · 0.88mm/px · z∈[-61,+78]mm · 3 of 48 slices shown (2 of 4)]
[im 1/48]
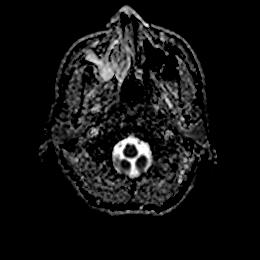
[im 24/48]
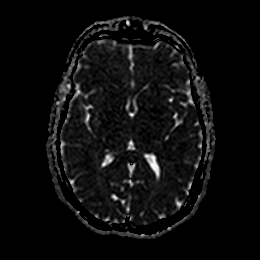
[im 48/48]
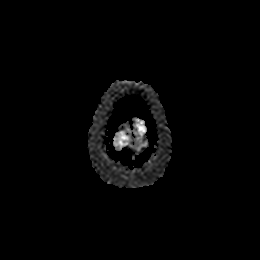

[Series 7: DWI · coronal · 4.0mm · 0.88mm/px · 4 of 64 slices shown (3 of 4)]
[im 1/64]
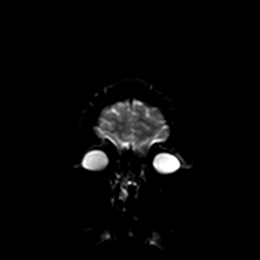
[im 22/64]
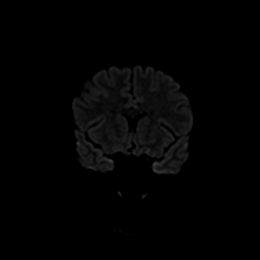
[im 43/64]
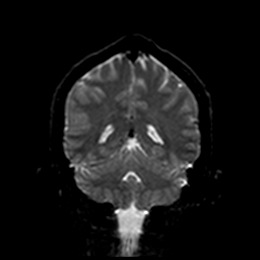
[im 64/64]
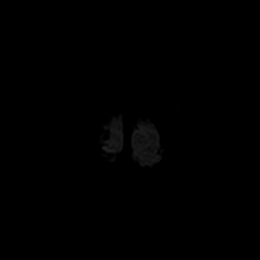

[Series 8: DWI · coronal · 4.0mm · 0.88mm/px · 2 of 32 slices shown (4 of 4)]
[im 1/32]
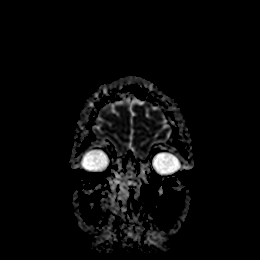
[im 32/32]
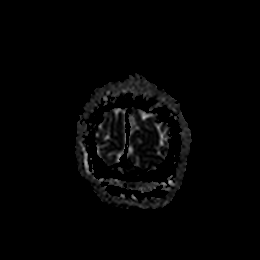

[Series 9: T1 · sagittal · 5.0mm · 0.75mm/px · 2 of 23 slices shown]
[im 1/23]
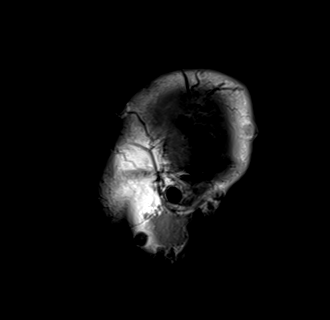
[im 23/23]
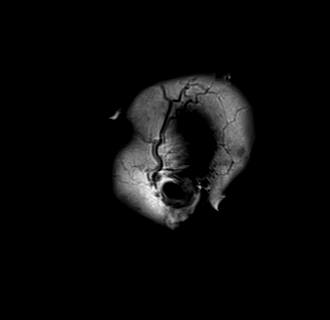

[Series 10: T2 · axial · 5.0mm · 0.72mm/px · z∈[-62,+80]mm · 2 of 25 slices shown]
[im 1/25]
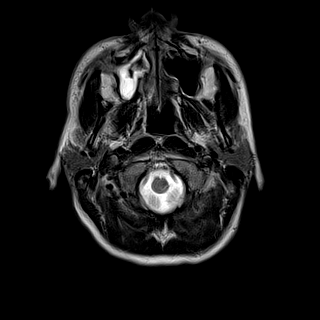
[im 25/25]
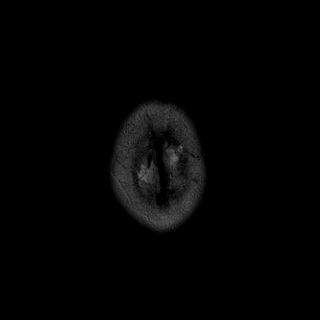

[Series 13: pha_images · axial · 3.0mm · 0.90mm/px · z∈[-76,+81]mm · 4 of 54 slices shown]
[im 1/54]
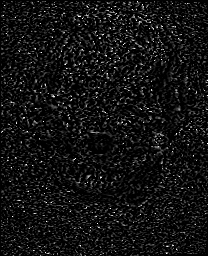
[im 18/54]
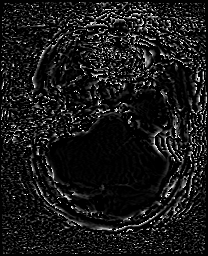
[im 36/54]
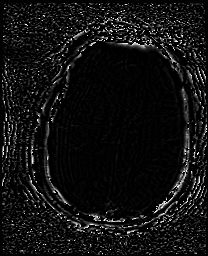
[im 54/54]
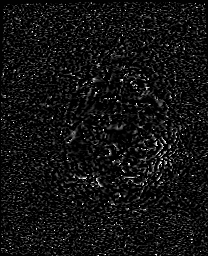

[Series 14: swi_images · axial · 3.0mm · 0.90mm/px · z∈[-76,+99]mm · 4 of 60 slices shown]
[im 1/60]
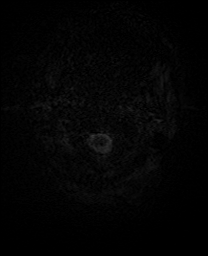
[im 20/60]
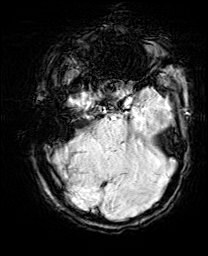
[im 40/60]
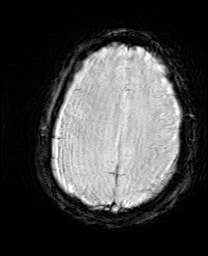
[im 60/60]
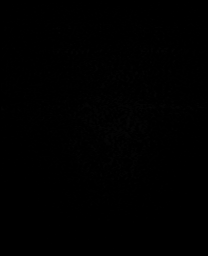

[Series 17: FLAIR · axial · 5.0mm · 0.45mm/px · z∈[-60,+82]mm · 2 of 25 slices shown]
[im 1/25]
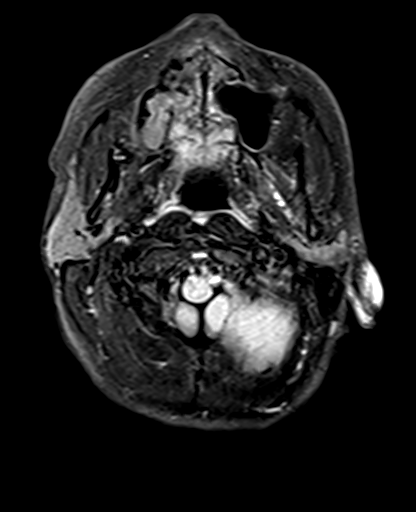
[im 25/25]
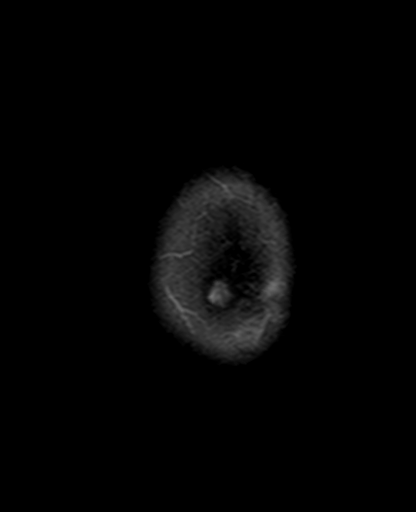

[Series 28: t1_mprage_tra_p2_iso · axial · 1.0mm · 0.98mm/px · z∈[-77,+95]mm · 8 of 174 slices shown]
[im 1/174]
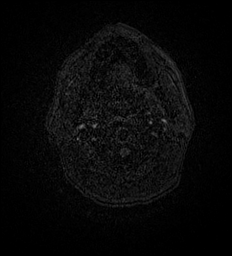
[im 32/174]
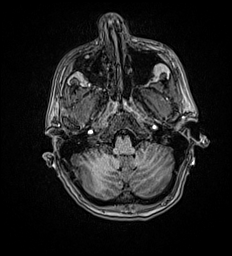
[im 48/174]
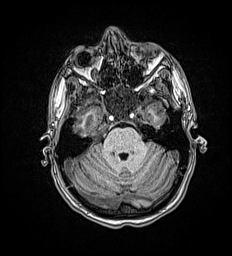
[im 79/174]
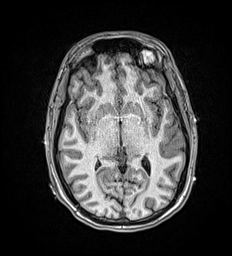
[im 95/174]
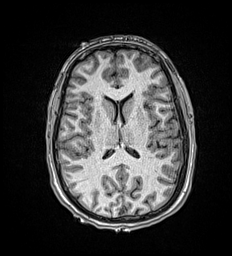
[im 126/174]
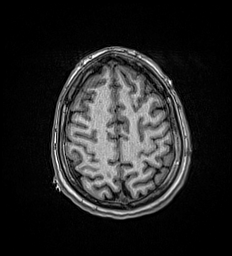
[im 142/174]
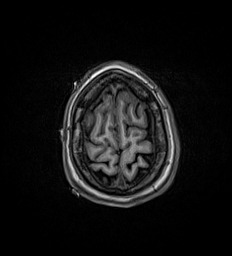
[im 174/174]
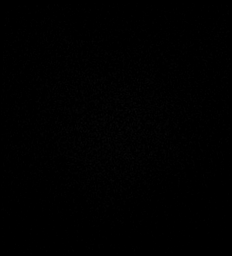

[Series 29: t1_mprage_tra_p2_iso_mpr_coronal · coronal · 1.0mm · 0.45mm/px · 7 of 120 slices shown]
[im 1/120]
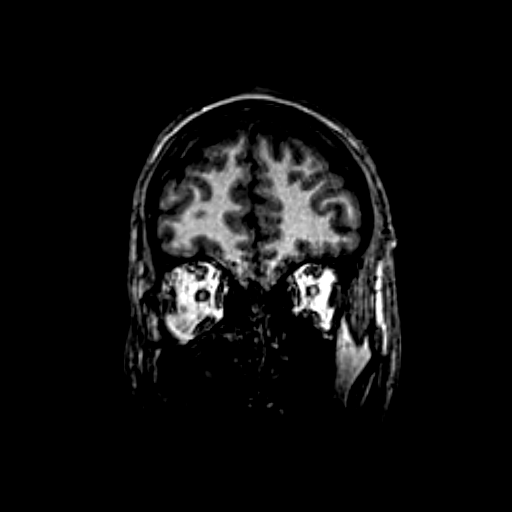
[im 18/120]
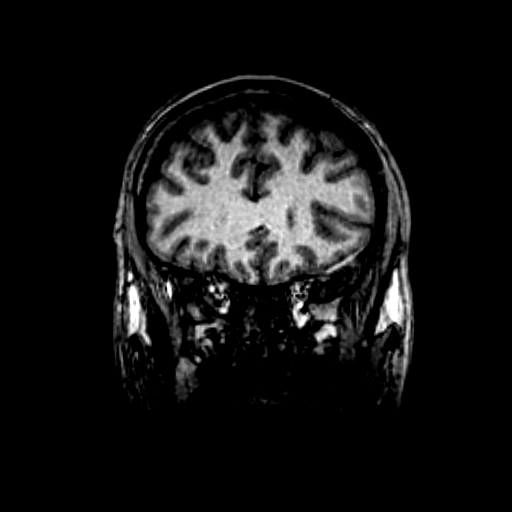
[im 35/120]
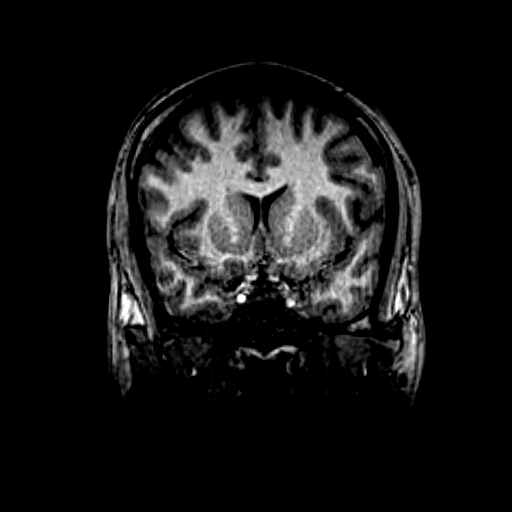
[im 52/120]
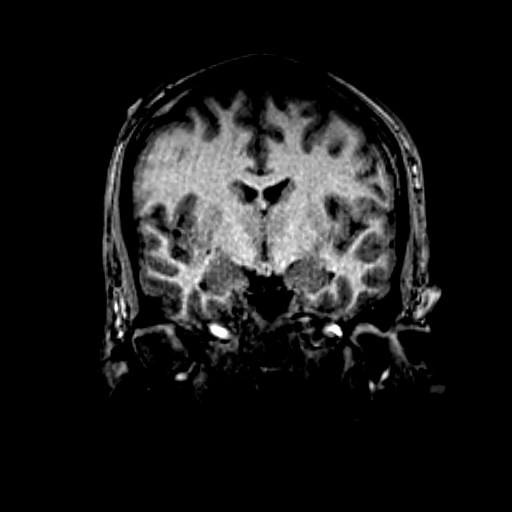
[im 69/120]
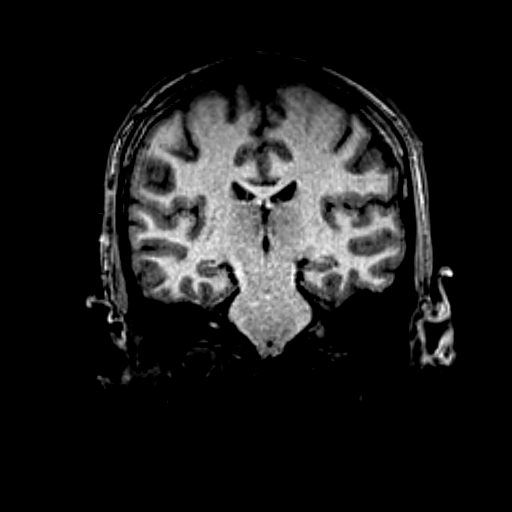
[im 86/120]
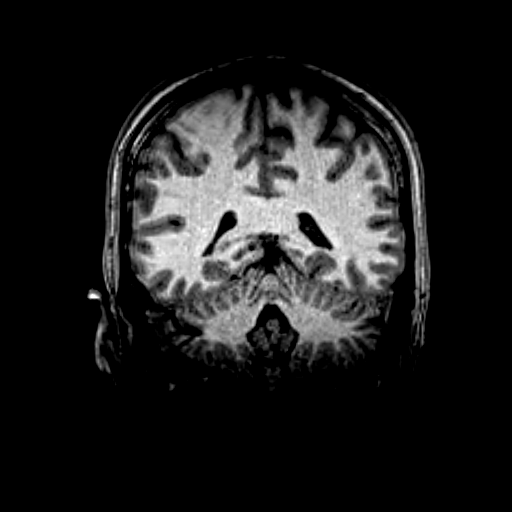
[im 103/120]
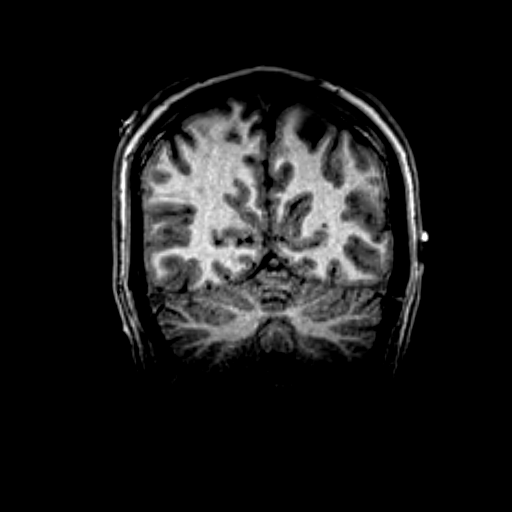

[43 of 48 positions shown; findings below may reference images not displayed]

FINDINGS: Brain: No acute infarction, hemorrhage, hydrocephalus, extra-axial
collection or mass lesion. No white matter disease, atrophy, or
masslike finding. Normal brain morphology.

Vascular: Major flow voids are preserved

Skull and upper cervical spine: Normal marrow signal.

Sinuses/Orbits: Mucosal thickening in the right maxillary and
sphenoid sinuses. Essentially single cavity sphenoid sinus likely
arising from the right. Negative orbits.

Other: Intermittent motion artifact
IMPRESSION: 1. Normal appearance of the brain.
2. Right maxillary and sphenoid sinusitis.
3. Motion degraded.
